# Patient Record
Sex: Male | Born: 1970 | Race: White | Hispanic: No | Marital: Single | Smoking: Never smoker
Health system: Southern US, Community
[De-identification: ages and names within clinical notes are randomized; demographics above are authoritative.]

## PROBLEM LIST (undated history)

## (undated) DIAGNOSIS — F32A Depression, unspecified: Secondary | ICD-10-CM

## (undated) DIAGNOSIS — F329 Major depressive disorder, single episode, unspecified: Secondary | ICD-10-CM

## (undated) HISTORY — DX: Depression, unspecified: F32.A

## (undated) HISTORY — DX: Major depressive disorder, single episode, unspecified: F32.9

---

## 2006-03-17 ENCOUNTER — Emergency Department: Payer: Self-pay | Admitting: Emergency Medicine
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
## 2008-06-08 ENCOUNTER — Emergency Department: Payer: Self-pay | Admitting: Emergency Medicine

## 2010-03-10 ENCOUNTER — Emergency Department: Payer: Self-pay | Admitting: Emergency Medicine

## 2010-05-21 IMAGING — CR DG ELBOW COMPLETE 3+V*L*
1 series · 4 of 4 positions shown · non-contrast
Comparison: none

REASON FOR EXAM: pain
COMMENTS:

PROCEDURE:     DXR - DXR ELBOW LT COMP W/OBLIQUES  - June 08, 2008  [DATE]
RESULT:     Four views of the LEFT elbow reveal the bones to be adequately
mineralized. I do not see evidence of an acute fracture. The overlying soft
tissues are normal in appearance.

[Series 1: view not recorded · 0.17mm/px · 4 of 4 slices shown]
[im 1/4]
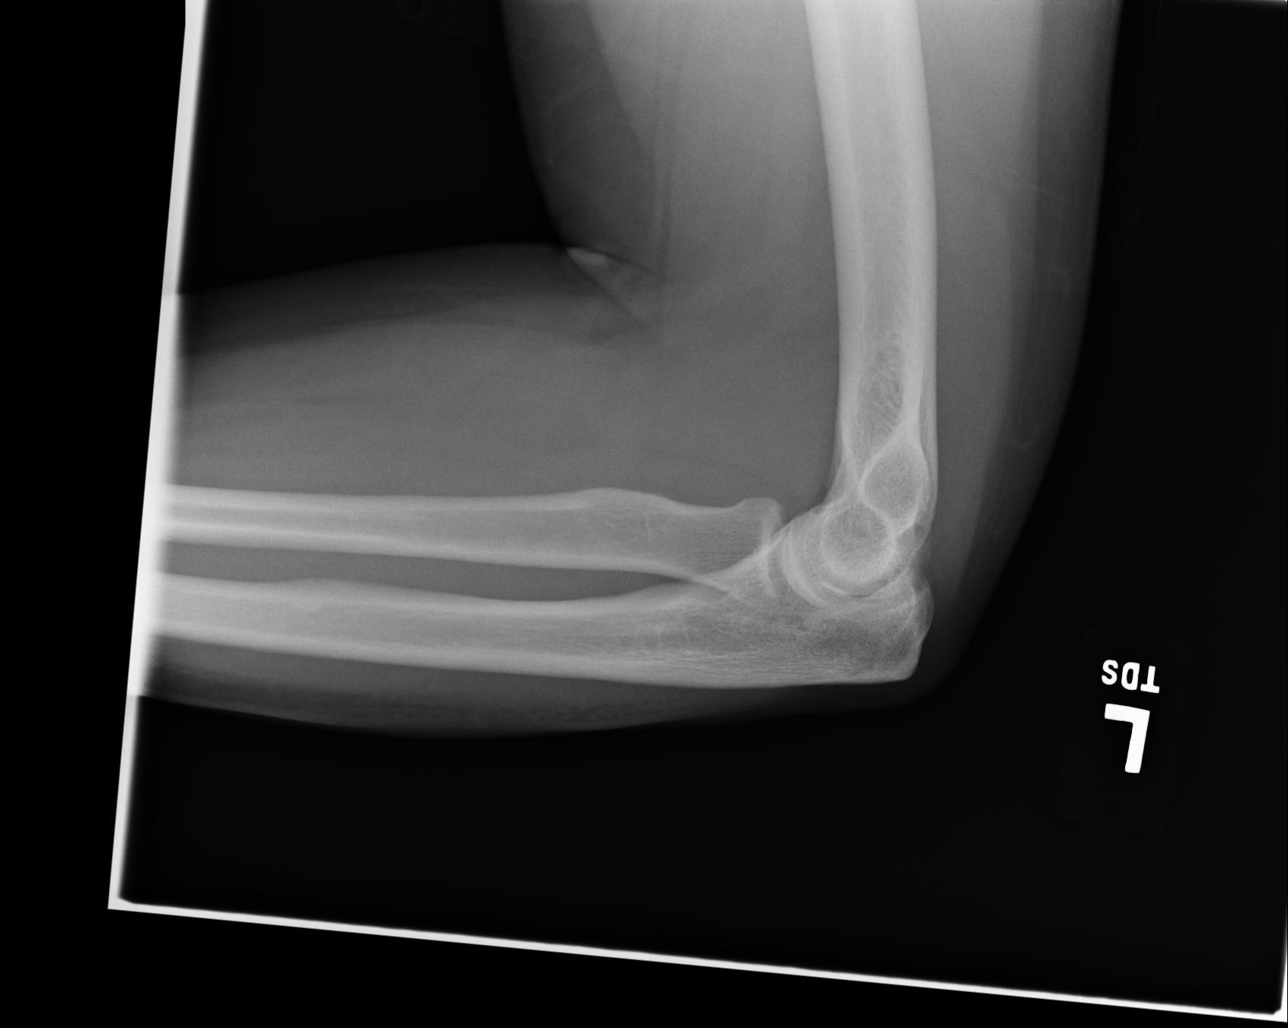
[im 2/4]
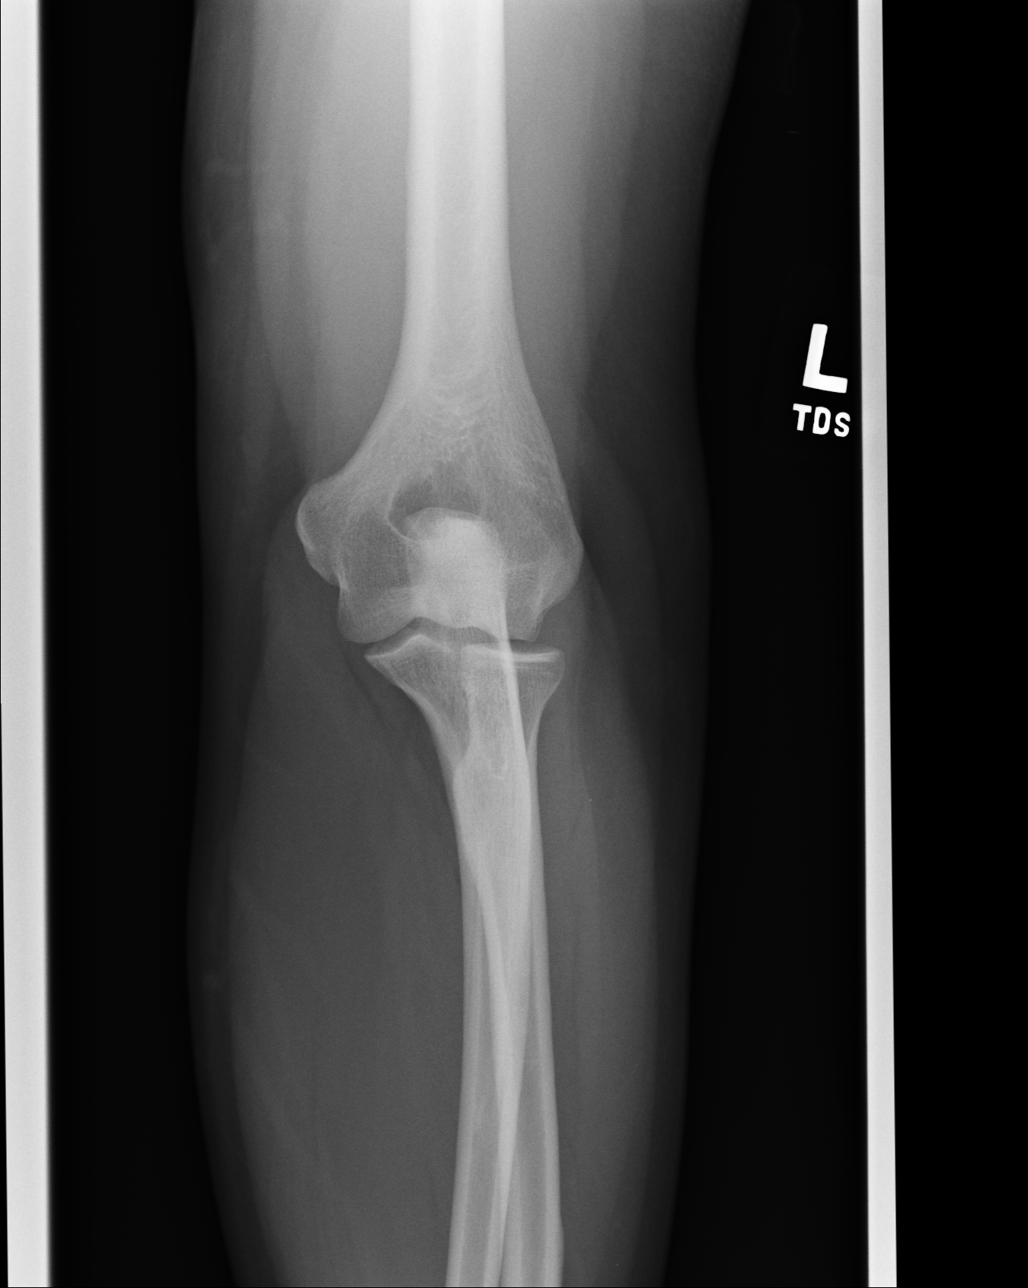
[im 3/4]
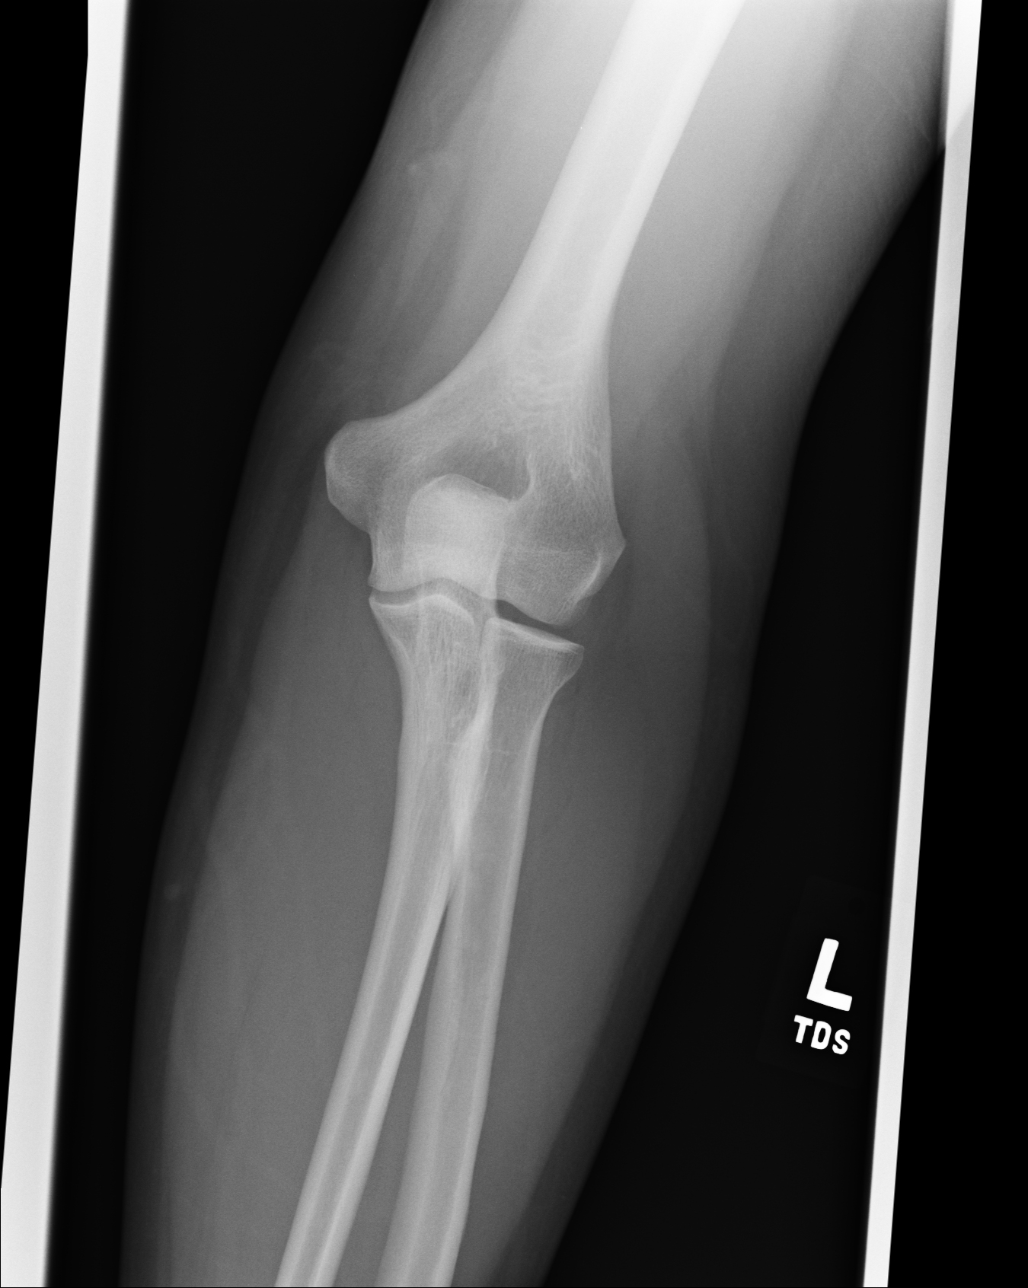
[im 4/4]
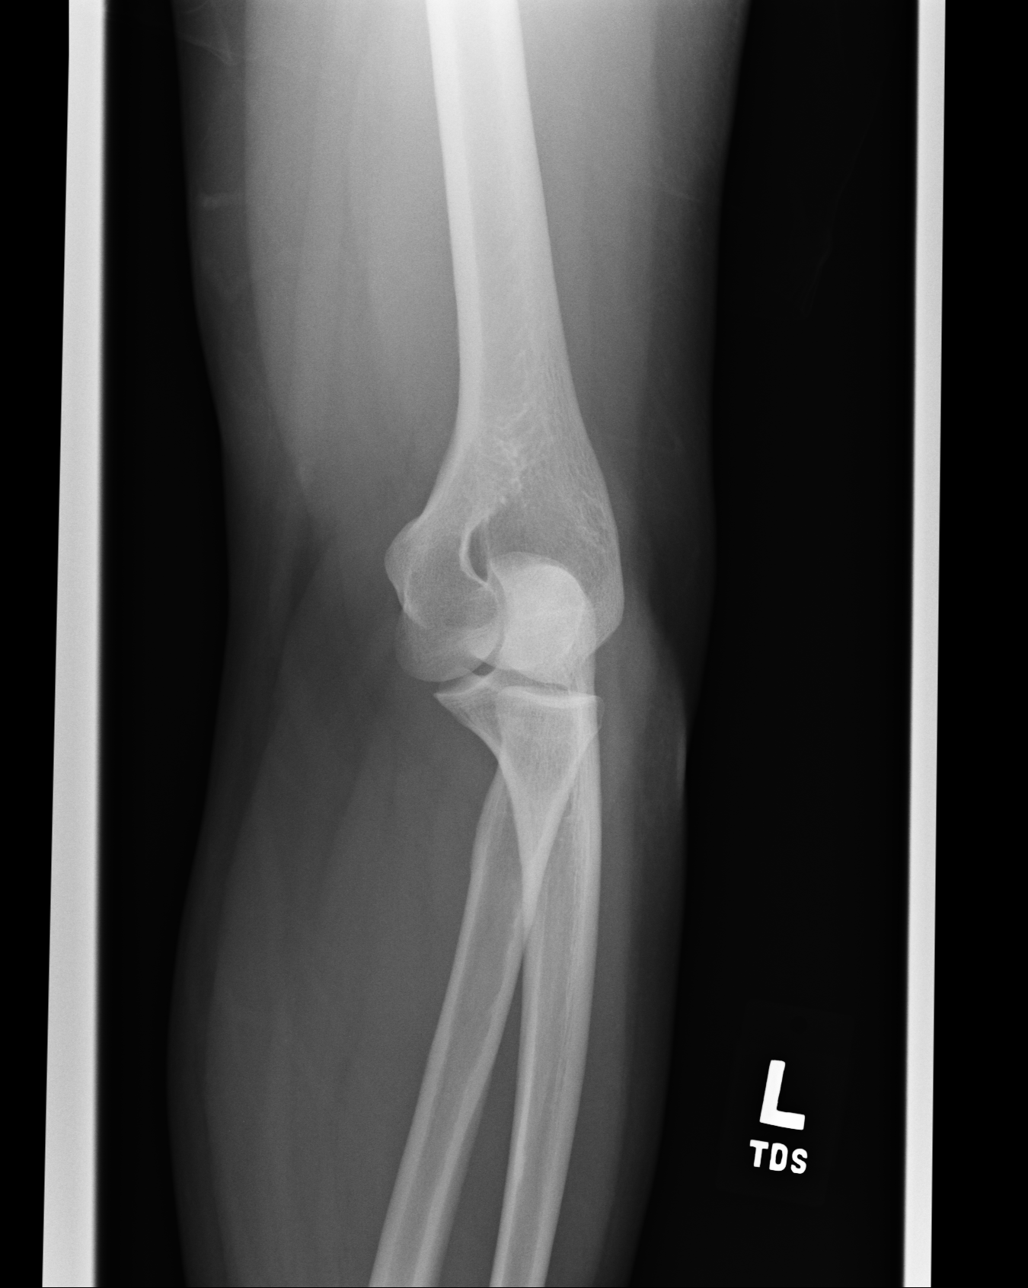

[4 of 4 positions shown; findings below may reference images not displayed]

IMPRESSION: I see no acute bony abnormality of the LEFT elbow. Followup imaging is
available if the patient's symptoms do not resolve in a fashion consistent
with an uncomplicated sprain or contusion.

## 2014-11-25 ENCOUNTER — Ambulatory Visit (INDEPENDENT_AMBULATORY_CARE_PROVIDER_SITE_OTHER): Payer: Self-pay | Admitting: Family Medicine

## 2014-11-25 ENCOUNTER — Encounter: Payer: Self-pay | Admitting: Family Medicine

## 2014-11-25 VITALS — BP 142/83 | HR 91 | Temp 98.2°F | Wt 307.4 lb

## 2014-11-25 DIAGNOSIS — J302 Other seasonal allergic rhinitis: Secondary | ICD-10-CM | POA: Insufficient documentation

## 2014-11-25 DIAGNOSIS — N529 Male erectile dysfunction, unspecified: Secondary | ICD-10-CM

## 2014-11-25 DIAGNOSIS — M545 Low back pain, unspecified: Secondary | ICD-10-CM | POA: Insufficient documentation

## 2014-11-25 MED ORDER — FEXOFENADINE-PSEUDOEPHED ER 180-240 MG PO TB24: 1.0000 | ORAL_TABLET | Freq: Every day | ORAL | Status: DC | PRN

## 2014-11-25 MED ORDER — TADALAFIL 20 MG PO TABS: 20.0000 mg | ORAL_TABLET | Freq: Every day | ORAL | Status: DC | PRN

## 2014-11-25 MED ORDER — AZELASTINE-FLUTICASONE 137-50 MCG/ACT NA SUSP: 2.0000 | Freq: Every day | NASAL | Status: DC

## 2014-11-25 MED ORDER — TRAMADOL HCL 50 MG PO TABS: 50.0000 mg | ORAL_TABLET | Freq: Four times a day (QID) | ORAL | Status: DC | PRN

## 2014-11-25 NOTE — Progress Notes (Signed)
BP 142/83 mmHg  Pulse 91  Temp(Src) 98.2 F (36.8 C) (Oral)  Wt 307 lb 6.4 oz (139.436 kg)   Subjective:    Patient ID: Matthew Cardenas, male    DOB: 05/22/1971, 44 y.o.   MRN: [id number]  HPI: Matthew CiproMichael Kruschke is a 44 y.o. 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 Interim medical history since our last visit reviewed. Allergies and medications reviewed and updated.  No current outpatient prescriptions on file prior to visit.   No current facility-administered medications on file prior to visit.    Review of Systems  Constitutional: Negative.   Respiratory: Negative.   Cardiovascular: Negative.     Per HPI unless specifically indicated above     Objective:    BP 142/83 mmHg  Pulse 91  Temp(Src) 98.2 F (36.8 C) (Oral)  Wt 307 lb 6.4 oz (139.436 kg)  Wt Readings from Last 3 Encounters:  11/25/14 307 lb 6.4 oz (139.436 kg)  04/22/14 305 lb (138.347 kg)    Physical Exam  Constitutional: He is oriented to person, place, and time. He appears well-developed and well-nourished. No distress.  HENT:  Head: Normocephalic and atraumatic.  Right Ear: Hearing normal.  Left Ear: Hearing normal.  Nose: Nose normal.  Eyes: Conjunctivae and lids are normal. Right eye exhibits no discharge. Left eye exhibits no discharge. No scleral icterus.  Cardiovascular: Normal rate and regular rhythm.   Pulmonary/Chest: Effort normal and breath sounds normal. No respiratory distress.  Musculoskeletal: Normal range of motion.  Neurological: He is alert and oriented to person, place, and time.  Skin: Skin is intact. No rash noted.  Psychiatric: He has a normal  mood and affect. His speech is normal and behavior is normal. Judgment and thought content normal. Cognition and memory are normal.    No results found for this or any previous visit.    Assessment & Plan:   Problem List Items Addressed This Visit      Respiratory   Other seasonal allergic rhinitis - Primary   Relevant Medications   Azelastine-Fluticasone 137-50 MCG/ACT SUSP   fexofenadine-pseudoephedrine (ALLEGRA-D 24) 180-240 MG per 24 hr tablet     Genitourinary   Erectile dysfunction   Relevant Medications   tadalafil (CIALIS) 20 MG tablet     Other   Lumbago   Relevant Medications   traMADol (ULTRAM) 50 MG tablet       Meds ordered this encounter  Medications  . Azelastine-Fluticasone 137-50 MCG/ACT SUSP    Sig: Place 2 Squirts into the nose daily.    Dispense:  1 Bottle    Refill:  12  . fexofenadine-pseudoephedrine (ALLEGRA-D 24) 180-240 MG per 24 hr tablet    Sig: Take 1 tablet by mouth daily as needed.    Dispense:  60 tablet    Refill:  6  . tadalafil (CIALIS) 20 MG tablet    Sig: Take 1 tablet (20 mg total) by mouth daily as needed for erectile dysfunction.    Dispense:  10 tablet    Refill:  12  . traMADol (ULTRAM) 50 MG tablet    Sig: Take 1-2  tablets (50-100 mg total) by mouth every 6 (six) hours as needed.    Dispense:  50 tablet    Refill:  1    Follow up plan: Return in about 4 months (around 03/27/2015) for Physical Exam.

## 2014-11-26 ENCOUNTER — Ambulatory Visit: Payer: Self-pay | Admitting: Family Medicine

## 2015-06-04 ENCOUNTER — Other Ambulatory Visit: Payer: Self-pay | Admitting: Family Medicine

## 2015-06-07 ENCOUNTER — Telehealth: Payer: Self-pay | Admitting: Family Medicine

## 2015-06-07 NOTE — Telephone Encounter (Signed)
[facility] [address] [place].  He preferred to speak with Harriett SineNancy or Dr Dossie Arbourrissman.

## 2018-05-10 ENCOUNTER — Other Ambulatory Visit: Payer: Self-pay

## 2018-05-10 ENCOUNTER — Ambulatory Visit: Payer: Self-pay | Admitting: Nurse Practitioner

## 2018-05-10 ENCOUNTER — Encounter: Payer: Self-pay | Admitting: Nurse Practitioner

## 2018-05-10 VITALS — BP 119/84 | HR 81 | Temp 98.0°F | Ht 72.0 in | Wt 254.0 lb

## 2018-05-10 DIAGNOSIS — M545 Low back pain: Secondary | ICD-10-CM

## 2018-05-10 DIAGNOSIS — J302 Other seasonal allergic rhinitis: Secondary | ICD-10-CM

## 2018-05-10 DIAGNOSIS — J40 Bronchitis, not specified as acute or chronic: Secondary | ICD-10-CM | POA: Insufficient documentation

## 2018-05-10 DIAGNOSIS — N529 Male erectile dysfunction, unspecified: Secondary | ICD-10-CM

## 2018-05-10 DIAGNOSIS — G8929 Other chronic pain: Secondary | ICD-10-CM

## 2018-05-10 MED ORDER — TRAMADOL HCL 50 MG PO TABS: 50.0000 mg | ORAL_TABLET | Freq: Three times a day (TID) | ORAL | 0 refills | Status: DC | PRN

## 2018-05-10 MED ORDER — ALBUTEROL SULFATE HFA 108 (90 BASE) MCG/ACT IN AERS: 2.0000 | INHALATION_SPRAY | RESPIRATORY_TRACT | 3 refills | Status: DC | PRN

## 2018-05-10 MED ORDER — TADALAFIL 20 MG PO TABS: 20.0000 mg | ORAL_TABLET | Freq: Every day | ORAL | 3 refills | Status: DC | PRN

## 2018-05-10 MED ORDER — FEXOFENADINE-PSEUDOEPHED ER 180-240 MG PO TB24: 1.0000 | ORAL_TABLET | Freq: Every day | ORAL | 6 refills | Status: DC | PRN

## 2018-05-10 MED ORDER — AZITHROMYCIN 250 MG PO TABS: ORAL_TABLET | ORAL | 0 refills | Status: DC

## 2018-05-10 NOTE — Progress Notes (Signed)
BP 119/84   Pulse 81   Temp 98 F (36.7 C) (Oral)   Ht 6' (1.829 m)   Wt 254 lb (115.2 kg)   SpO2 95%   BMI 34.45 kg/m    Subjective:    Patient ID: Matthew Cardenas, male    DOB: 12/07/1970, 47 y.o.   MRN: [id number]  HPI: Matthew Cardenas is a 47 y.o. male presents for URI symptoms  Chief Complaint  Patient presents with  . Cough    pt states that has had chest congestion for about two days/ pt states stated coughing thick greenish phlegm  . Nasal Congestion   UPPER RESPIRATORY TRACT INFECTION He reports he gets bronchitis every year at this time.  States he gets treated every year for it with z-pack.   Worst symptom: Fever: no Cough: yes Shortness of breath: yes , has had to use inhaler a couple times Wheezing: no Chest pain: no Chest tightness: yes Chest congestion: yes Nasal congestion: yes Runny nose: yes Post nasal drip: yes Sneezing: yes Sore throat: no Swollen glands: no Sinus pressure: no Headache: no Face pain: no Toothache: no Ear pain: no none Ear pressure: no none Eyes red/itching:no Eye drainage/crusting: no  Vomiting: no Rash: no Fatigue: no Sick contacts: yes, works with public Strep contacts: no  Context: worse Recurrent sinusitis: no Relief with OTC cold/cough medications: no  Treatments attempted: cold/sinus and mucinex   LUMBAGO: Reports chronic back pain, that he takes Tramadol for minimally.  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  Is requesting refill on Tramadol.  On review he has a Tramadol script, but he reports he minimally uses.  Last script lasted him over a year, 50 pills.  [facility] of [facility] no recent prescriptions noted.  ERECTILE DYSFUNCTION: Requesting refill of Cialis.  States uses only on occasional basis and it offers benefit.  Relevant past medical, surgical, family and social history reviewed and updated as indicated. Interim medical history since our last visit  reviewed. Allergies and medications reviewed and updated.  Review of Systems  Constitutional: Positive for fatigue. Negative for activity change, appetite change, chills, diaphoresis and fever.  HENT: Positive for postnasal drip, rhinorrhea and sneezing. Negative for congestion, dental problem, ear discharge, ear pain, mouth sores, nosebleeds, sinus pressure, sinus pain, sore throat, trouble swallowing and voice change.   Eyes: Negative for pain, itching and visual disturbance.  Respiratory: Positive for cough, chest tightness and shortness of breath. Negative for choking and wheezing.   Cardiovascular: Negative for chest pain, palpitations and leg swelling.  Gastrointestinal: Negative for abdominal distention, abdominal pain, constipation, diarrhea, nausea and vomiting.  Musculoskeletal: Positive for back pain. Negative for arthralgias and myalgias.  Neurological: Negative for dizziness, syncope, weakness, light-headedness and headaches.  Psychiatric/Behavioral: Negative for behavioral problems, confusion, decreased concentration and sleep disturbance. The patient is not nervous/anxious.    Per HPI unless specifically indicated above     Objective:    BP 119/84   Pulse 81   Temp 98 F (36.7 C) (Oral)   Ht 6' (1.829 m)   Wt 254 lb (115.2 kg)   SpO2 95%   BMI 34.45 kg/m   Wt Readings from Last 3 Encounters:  05/10/18 254 lb (115.2 kg)  11/25/14 (!) 307 lb 6.4 oz (139.4 kg)  04/22/14 (!) 305 lb (138.3 kg)    Physical Exam  Constitutional: He appears well-developed and well-nourished.  HENT:  Head: Normocephalic and atraumatic.  Right Ear: Hearing,  tympanic membrane, external ear and ear canal normal.  Left Ear: Hearing, tympanic membrane, external ear and ear canal normal.  Nose: Mucosal edema and rhinorrhea present. Right sinus exhibits no maxillary sinus tenderness and no frontal sinus tenderness. Left sinus exhibits no maxillary sinus tenderness and no frontal sinus  tenderness.  Mouth/Throat: Oropharynx is clear and moist.  Eyes: Pupils are equal, round, and reactive to light. Conjunctivae, EOM and lids are normal. Right eye exhibits no discharge. Left eye exhibits no discharge.  Neck: No JVD present. Carotid bruit is not present. No thyromegaly present.  Cardiovascular: Normal rate, regular rhythm and normal heart sounds. Exam reveals no gallop.  No murmur heard. Pulmonary/Chest: Effort normal. He has wheezes.  Intermittent expiratory wheezes scattered throughout  Abdominal: Soft. Bowel sounds are normal.  Neurological: He is alert.  Skin: Skin is warm and dry.  Psychiatric: He has a normal mood and affect. His behavior is normal. Judgment and thought content normal.    No results found for this or any previous visit.    Assessment & Plan:   Problem List Items Addressed This Visit      Respiratory   Other seasonal allergic rhinitis   Relevant Medications   fexofenadine-pseudoephedrine (ALLEGRA-D 24) 180-240 MG 24 hr tablet   Bronchitis - Primary    Acute presentation with expiratory wheezes scattered, is about to go on road to travel for job.  Script for zpack and new inhaler provided.  Instructed not to take zpack unless no improvement in symptoms in 5-7 days or worsening symptoms.        Other   Lumbago    Continues on Tramadol with only sparse use, monitor and if increased use send to pain clinic.  Script sent for 30 pill with no refills.      Relevant Medications   traMADol (ULTRAM) 50 MG tablet   Erectile dysfunction    Continues on Cialis with minimal use.  Script sent.      Relevant Medications   tadalafil (ADCIRCA/CIALIS) 20 MG tablet      Controlled substance.  Checked data base and no other refills of controlled substances noted over the past several months.  Follow up plan: Return if symptoms worsen or fail to improve.

## 2018-05-10 NOTE — Assessment & Plan Note (Signed)
Continues on Cialis with minimal use.  Script sent.

## 2018-05-10 NOTE — Assessment & Plan Note (Addendum)
Continues on Tramadol with only sparse use, monitor and if increased use send to pain clinic.  Script sent for 30 pill with no refills.

## 2018-05-10 NOTE — Patient Instructions (Signed)

## 2018-05-10 NOTE — Assessment & Plan Note (Signed)
Acute presentation with expiratory wheezes scattered, is about to go on road to travel for job.  Script for zpack and new inhaler provided.  Instructed not to take zpack unless no improvement in symptoms in 5-7 days or worsening symptoms.

## 2019-02-21 ENCOUNTER — Encounter: Payer: Self-pay | Admitting: Family Medicine

## 2019-02-21 ENCOUNTER — Other Ambulatory Visit: Payer: Self-pay

## 2019-02-21 ENCOUNTER — Ambulatory Visit (INDEPENDENT_AMBULATORY_CARE_PROVIDER_SITE_OTHER): Payer: Self-pay | Admitting: Family Medicine

## 2019-02-21 VITALS — BP 118/82 | HR 73 | Temp 98.1°F | Ht 72.0 in | Wt 259.0 lb

## 2019-02-21 DIAGNOSIS — G8929 Other chronic pain: Secondary | ICD-10-CM

## 2019-02-21 DIAGNOSIS — F419 Anxiety disorder, unspecified: Secondary | ICD-10-CM

## 2019-02-21 DIAGNOSIS — N529 Male erectile dysfunction, unspecified: Secondary | ICD-10-CM

## 2019-02-21 DIAGNOSIS — R030 Elevated blood-pressure reading, without diagnosis of hypertension: Secondary | ICD-10-CM

## 2019-02-21 DIAGNOSIS — M545 Low back pain, unspecified: Secondary | ICD-10-CM

## 2019-02-21 DIAGNOSIS — J302 Other seasonal allergic rhinitis: Secondary | ICD-10-CM

## 2019-02-21 MED ORDER — ALBUTEROL SULFATE HFA 108 (90 BASE) MCG/ACT IN AERS: 2.0000 | INHALATION_SPRAY | RESPIRATORY_TRACT | 3 refills | Status: DC | PRN

## 2019-02-21 MED ORDER — TRAMADOL HCL 50 MG PO TABS: 50.0000 mg | ORAL_TABLET | Freq: Three times a day (TID) | ORAL | 0 refills | Status: DC | PRN

## 2019-02-21 MED ORDER — TADALAFIL 20 MG PO TABS: 20.0000 mg | ORAL_TABLET | Freq: Every day | ORAL | 3 refills | Status: DC | PRN

## 2019-02-21 MED ORDER — ALPRAZOLAM 0.25 MG PO TABS: 0.2500 mg | ORAL_TABLET | Freq: Every day | ORAL | 0 refills | Status: DC | PRN

## 2019-02-21 MED ORDER — FLUOXETINE HCL 10 MG PO CAPS: 10.0000 mg | ORAL_CAPSULE | Freq: Every day | ORAL | 0 refills | Status: DC

## 2019-02-21 MED ORDER — FEXOFENADINE-PSEUDOEPHED ER 180-240 MG PO TB24: 1.0000 | ORAL_TABLET | Freq: Every day | ORAL | 6 refills | Status: DC | PRN

## 2019-02-21 NOTE — Progress Notes (Signed)
BP 118/82   Pulse 73   Temp 98.1 F (36.7 C) (Oral)   Ht 6' (1.829 m)   Wt 259 lb (117.5 kg)   SpO2 97%   BMI 35.13 kg/m    Subjective:    Patient ID: Matthew Cardenas, male    DOB: 09/11/1970, 48 y.o.   MRN: [id number]  HPI: Matthew CiproMichael Tutor is a 48 y.o. male  Chief Complaint  Patient presents with  . Hypertension    fist noticed about 2-3 weeks ago, had had headaches   Notes the last few weeks when exerting himself or getting hot he will get a headache and his BP will go up. Dull, pounding headache typically in the back of the head. Has been out of work since February and has been very stressed about that. His brother gave him a xanax a week ago on a very bad day and states that took everything away for him. Feels much of his issue is anxiety related. Denies mood concerns, SI/HI. Has never had BP issues in the past like this.   Low back pain, using a chiropractor regularly but keeps tramadol around for rare as needed use. Works a very physical job. Notes he never even filled last year's script.   Keeps cialis around because the allegra D causes several weeks of ED each time he has to take it. Rarely if ever takes it.   Has seasonal allergies for which he takes antihistamines and occasionally will need to use an inhaler. Needing a refill.   No flowsheet data found. No flowsheet data found.    Relevant past medical, surgical, family and social history reviewed and updated as indicated. Interim medical history since our last visit reviewed. Allergies and medications reviewed and updated.  Review of Systems  Per HPI unless specifically indicated above     Objective:    BP 118/82   Pulse 73   Temp 98.1 F (36.7 C) (Oral)   Ht 6' (1.829 m)   Wt 259 lb (117.5 kg)   SpO2 97%   BMI 35.13 kg/m   Wt Readings from Last 3 Encounters:  02/21/19 259 lb (117.5 kg)  05/10/18 254 lb (115.2 kg)  11/25/14 (!) 307 lb 6.4 oz (139.4 kg)    Physical Exam Vitals signs and  nursing note reviewed.  Constitutional:      Appearance: Normal appearance.  HENT:     Head: Atraumatic.  Eyes:     Extraocular Movements: Extraocular movements intact.     Conjunctiva/sclera: Conjunctivae normal.  Neck:     Musculoskeletal: Normal range of motion and neck supple.  Cardiovascular:     Rate and Rhythm: Normal rate and regular rhythm.  Pulmonary:     Effort: Pulmonary effort is normal.     Breath sounds: Normal breath sounds.  Musculoskeletal: Normal range of motion.  Skin:    General: Skin is warm and dry.  Neurological:     General: No focal deficit present.     Mental Status: He is oriented to person, place, and time.  Psychiatric:        Mood and Affect: Mood normal.        Thought Content: Thought content normal.        Judgment: Judgment normal.     No results found for this or any previous visit.    Assessment & Plan:   Problem List Items Addressed This Visit      Respiratory   Other seasonal allergic rhinitis  Continue antihistamines prn, albuterol refilled for prn use      Relevant Medications   fexofenadine-pseudoephedrine (ALLEGRA-D 24) 180-240 MG 24 hr tablet     Other   Lumbago    Chronic, sees chiropractor regularly. Will renew tramadol to have on hand for severe flares. Precautions reviewed      Relevant Medications   traMADol (ULTRAM) 50 MG tablet   Erectile dysfunction    Related to use of allegra D. Will renew script to have on hand      Relevant Medications   tadalafil (CIALIS) 20 MG tablet   Anxiety    Will start daily prozac and increase as needed. Small script of xanax given for rare prn use. Discussed never using alongside a tramadol or other controlled substance. Recheck in 1 month      Relevant Medications   FLUoxetine (PROZAC) 10 MG capsule   ALPRAZolam (XANAX) 0.25 MG tablet    Other Visit Diagnoses    Elevated blood pressure, situational    -  Primary   Suspect anxiety or weight gain related. DASH diet,  exercise, weight loss, anxiety control reviewed. Recheck in 1 month   Relevant Medications   tadalafil (CIALIS) 20 MG tablet       Follow up plan: Return in about 4 weeks (around 03/21/2019) for Anxiety, headache f/u.

## 2019-02-24 DIAGNOSIS — F419 Anxiety disorder, unspecified: Secondary | ICD-10-CM | POA: Insufficient documentation

## 2019-02-24 NOTE — Assessment & Plan Note (Signed)
Chronic, sees chiropractor regularly. Will renew tramadol to have on hand for severe flares. Precautions reviewed

## 2019-02-24 NOTE — Assessment & Plan Note (Signed)
Continue antihistamines prn, albuterol refilled for prn use

## 2019-02-24 NOTE — Assessment & Plan Note (Signed)
Will start daily prozac and increase as needed. Small script of xanax given for rare prn use. Discussed never using alongside a tramadol or other controlled substance. Recheck in 1 month

## 2019-02-24 NOTE — Assessment & Plan Note (Signed)
Related to use of allegra D. Will renew script to have on hand

## 2019-03-20 ENCOUNTER — Encounter: Payer: Self-pay | Admitting: Family Medicine

## 2019-03-20 ENCOUNTER — Other Ambulatory Visit: Payer: Self-pay

## 2019-03-20 ENCOUNTER — Ambulatory Visit (INDEPENDENT_AMBULATORY_CARE_PROVIDER_SITE_OTHER): Payer: Self-pay | Admitting: Family Medicine

## 2019-03-20 VITALS — BP 126/83 | HR 73 | Temp 97.9°F | Ht 72.0 in | Wt 265.0 lb

## 2019-03-20 DIAGNOSIS — F419 Anxiety disorder, unspecified: Secondary | ICD-10-CM

## 2019-03-20 DIAGNOSIS — G44219 Episodic tension-type headache, not intractable: Secondary | ICD-10-CM

## 2019-03-20 MED ORDER — ALPRAZOLAM 0.25 MG PO TABS: 0.2500 mg | ORAL_TABLET | Freq: Every day | ORAL | 0 refills | Status: DC | PRN

## 2019-03-20 MED ORDER — FLUOXETINE HCL 20 MG PO CAPS: 20.0000 mg | ORAL_CAPSULE | Freq: Every day | ORAL | 0 refills | Status: DC

## 2019-03-20 MED ORDER — CYCLOBENZAPRINE HCL 10 MG PO TABS: 10.0000 mg | ORAL_TABLET | Freq: Three times a day (TID) | ORAL | 0 refills | Status: DC | PRN

## 2019-03-20 NOTE — Progress Notes (Signed)
BP 126/83   Pulse 73   Temp 97.9 F (36.6 C) (Oral)   Ht 6' (1.829 m)   Wt 265 lb (120.2 kg)   SpO2 96%   BMI 35.94 kg/m    Subjective:    Patient ID: Matthew Cardenas, male    DOB: 09-21-1970, 48 y.o.   MRN: [id number]  HPI: Matthew Cardenas is a 48 y.o. male  Chief Complaint  Patient presents with  . Anxiety  . Headache   Patient here today following up on anxiety. Started on low dose prozac which he states may help some but has not made a huge impact thus far. Has had to take the xanax numerous times the past month in addition, particularly when something related to finances or his employment issues comes up. Notes the xanax helps almost immediately and helps him cope well. Denies side effects or issues with either medication. Notes he has these pressure headaches when he gest anxious and the xanax takes them away more than anything else ever has.   Depression screen [facility] 2/9 03/20/2019  Decreased Interest 1  Down, Depressed, Hopeless 0  PHQ - 2 Score 1  Altered sleeping 1  Tired, decreased energy 1  Change in appetite 1  Feeling bad or failure about yourself  0  Trouble concentrating 2  Moving slowly or fidgety/restless 0  Suicidal thoughts 0  PHQ-9 Score 6   GAD 7 : Generalized Anxiety Score 03/20/2019  Nervous, Anxious, on Edge 2  Control/stop worrying 2  Worry too much - different things 1  Trouble relaxing 1  Restless 1  Easily annoyed or irritable 2  Afraid - awful might happen 0  Total GAD 7 Score 9  Anxiety Difficulty Somewhat difficult   Relevant past medical, surgical, family and social history reviewed and updated as indicated. Interim medical history since our last visit reviewed. Allergies and medications reviewed and updated.  Review of Systems  Per HPI unless specifically indicated above     Objective:    BP 126/83   Pulse 73   Temp 97.9 F (36.6 C) (Oral)   Ht 6' (1.829 m)   Wt 265 lb (120.2 kg)   SpO2 96%   BMI 35.94 kg/m   Wt  Readings from Last 3 Encounters:  03/20/19 265 lb (120.2 kg)  02/21/19 259 lb (117.5 kg)  05/10/18 254 lb (115.2 kg)    Physical Exam Vitals signs and nursing note reviewed.  Constitutional:      Appearance: Normal appearance.  HENT:     Head: Atraumatic.  Eyes:     Extraocular Movements: Extraocular movements intact.     Conjunctiva/sclera: Conjunctivae normal.  Neck:     Musculoskeletal: Normal range of motion and neck supple.  Cardiovascular:     Rate and Rhythm: Normal rate and regular rhythm.  Pulmonary:     Effort: Pulmonary effort is normal.     Breath sounds: Normal breath sounds.  Musculoskeletal: Normal range of motion.  Skin:    General: Skin is warm and dry.  Neurological:     General: No focal deficit present.     Mental Status: He is oriented to person, place, and time.  Psychiatric:        Mood and Affect: Mood normal.        Thought Content: Thought content normal.        Judgment: Judgment normal.     No results found for this or any previous visit.    Assessment &  Plan:   Problem List Items Addressed This Visit      Other   Anxiety - Primary    Increase prozac, continue prn use of xanax. Discussed his frequency of use, going through about 10 pills this past month. Hoping to reduce need/frequency once preventative medication is where it needs to be. Declines counseling, particularly due to lack of insurance and current financial struggles.       Relevant Medications   FLUoxetine (PROZAC) 20 MG capsule   ALPRAZolam (XANAX) 0.25 MG tablet    Other Visit Diagnoses    Episodic tension-type headache, not intractable       With stress/tension triggers. Tx with flexeril prn, stress reduction techniques, continued med mgmt to get anxiety under better control.    Relevant Medications   FLUoxetine (PROZAC) 20 MG capsule   cyclobenzaprine (FLEXERIL) 10 MG tablet     Greater than 25 minutes spent today in direct care and counseling with patient.   Follow  up plan: Return in about 4 weeks (around 04/17/2019) for anxiety.

## 2019-03-24 NOTE — Assessment & Plan Note (Signed)
Increase prozac, continue prn use of xanax. Discussed his frequency of use, going through about 10 pills this past month. Hoping to reduce need/frequency once preventative medication is where it needs to be. Declines counseling, particularly due to lack of insurance and current financial struggles.

## 2019-04-17 ENCOUNTER — Other Ambulatory Visit: Payer: Self-pay

## 2019-04-17 ENCOUNTER — Ambulatory Visit (INDEPENDENT_AMBULATORY_CARE_PROVIDER_SITE_OTHER): Payer: Self-pay | Admitting: Family Medicine

## 2019-04-17 ENCOUNTER — Encounter: Payer: Self-pay | Admitting: Family Medicine

## 2019-04-17 VITALS — BP 117/76 | HR 67 | Temp 98.3°F

## 2019-04-17 DIAGNOSIS — M545 Low back pain, unspecified: Secondary | ICD-10-CM

## 2019-04-17 DIAGNOSIS — G8929 Other chronic pain: Secondary | ICD-10-CM

## 2019-04-17 DIAGNOSIS — F419 Anxiety disorder, unspecified: Secondary | ICD-10-CM

## 2019-04-17 DIAGNOSIS — G44219 Episodic tension-type headache, not intractable: Secondary | ICD-10-CM

## 2019-04-17 MED ORDER — FLUOXETINE HCL 20 MG PO CAPS: 20.0000 mg | ORAL_CAPSULE | Freq: Every day | ORAL | 1 refills | Status: DC

## 2019-04-17 MED ORDER — ALPRAZOLAM 0.25 MG PO TABS: 0.2500 mg | ORAL_TABLET | Freq: Every day | ORAL | 0 refills | Status: DC | PRN

## 2019-04-17 NOTE — Progress Notes (Signed)
BP 117/76   Pulse 67   Temp 98.3 F (36.8 C)   SpO2 96%    Subjective:    Patient ID: Matthew Cardenas, male    DOB: 09/20/1970, 48 y.o.   MRN: [id number]  HPI: Matthew Cardenas is a 48 y.o. male  Chief Complaint  Patient presents with  . Anxiety   Here today for 1 month anxiety f/u. Feeling a great benefit from the increased prozac dose. Has had to use the xanax more than he'd like so has used his 10 tabs already the past month. Has 1 left per patient. Typically his biggest stressor is finances since getting laid off during [place] [employer]. Having a really hard time with that.   States his headaches seem to have dissipated quite a bit since the prozac, thinks a lot of his muscle tension has been relieved with that. Tried the flexeril twice and both times slept for over 14 hours and felt like a zombie for another day after.   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 Used most of his tramadol he has on hand due to this severe flare of his back pain. Denies any radiation of pain down his legs, weakness, numbness or tingling.   Depression screen [facility] 2/9 04/17/2019 03/20/2019  Decreased Interest 1 1  Down, Depressed, Hopeless 0 0  PHQ - 2 Score 1 1  Altered sleeping 0 1  Tired, decreased energy 3 1  Change in appetite 1 1  Feeling bad or failure about yourself  0 0  Trouble concentrating 1 2  Moving slowly or fidgety/restless 0 0  Suicidal thoughts 0 0  PHQ-9 Score 6 6   GAD 7 : Generalized Anxiety Score 04/17/2019 03/20/2019  Nervous, Anxious, on Edge 1 2  Control/stop worrying 1 2  Worry too much - different things 1 1  Trouble relaxing 1 1  Restless 1 1  Easily annoyed or irritable 1 2  Afraid - awful might happen 1 0  Total GAD 7 Score 7 9  Anxiety Difficulty Somewhat difficult Somewhat difficult   Relevant past medical, surgical, family and social history reviewed and updated as  indicated. Interim medical history since our last visit reviewed. Allergies and medications reviewed and updated.  Review of Systems  Per HPI unless specifically indicated above     Objective:    BP 117/76   Pulse 67   Temp 98.3 F (36.8 C)   SpO2 96%   Wt Readings from Last 3 Encounters:  03/20/19 265 lb (120.2 kg)  02/21/19 259 lb (117.5 kg)  05/10/18 254 lb (115.2 kg)    Physical Exam Vitals signs and nursing note reviewed.  Constitutional:      Appearance: Normal appearance.  HENT:     Head: Atraumatic.  Eyes:     Extraocular Movements: Extraocular movements intact.     Conjunctiva/sclera: Conjunctivae normal.  Neck:     Musculoskeletal: Normal range of motion and neck supple.  Cardiovascular:     Rate and Rhythm: Normal rate and regular rhythm.  Pulmonary:     Effort: Pulmonary effort is normal.     Breath sounds: Normal breath sounds.  Musculoskeletal: Normal range of motion.        General: No tenderness or deformity.     Comments: - SLR b/l   Skin:    General: Skin is warm and dry.  Neurological:  General: No focal deficit present.     Mental Status: He is oriented to person, place, and time.  Psychiatric:        Mood and Affect: Mood normal.        Thought Content: Thought content normal.        Judgment: Judgment normal.     No results found for this or any previous visit.    Assessment & Plan:   Problem List Items Addressed This Visit      Other   Lumbago    Exacerbated recently by some heavy machinery lifting at work. Has used most of the tramadol supply he had. Discussed this was to last him 6-12 months and that we would not be able to fill early. Also discussed the significant dangers of taking this while taking the xanax. He is agreeable to OTC pain relievers, heat, massage, and lidocaine patches      Anxiety - Primary    Will increase prozac dose and continue to monitor. Used the xanax more quickly than anticipated due to stressors,  discussed using more sparingly. This supply should last at least 6 months for him. Risks and precautions reviewed      Relevant Medications   ALPRAZolam (XANAX) 0.25 MG tablet   FLUoxetine (PROZAC) 20 MG capsule    Other Visit Diagnoses    Episodic tension-type headache, not intractable       Did not tolerate flexeril. HAs seemed to improve with the prozac helping his stress. Continue to monitor    Relevant Medications   FLUoxetine (PROZAC) 20 MG capsule       Follow up plan: Return in about 6 months (around 10/16/2019) for Mood f/u.

## 2019-04-21 NOTE — Assessment & Plan Note (Addendum)
Will increase prozac dose and continue to monitor. Used the xanax more quickly than anticipated due to stressors, discussed using more sparingly. This supply should last at least 6 months for him. Risks and precautions reviewed

## 2019-04-21 NOTE — Assessment & Plan Note (Signed)
Exacerbated recently by some heavy machinery lifting at work. Has used most of the tramadol supply he had. Discussed this was to last him 6-12 months and that we would not be able to fill early. Also discussed the significant dangers of taking this while taking the xanax. He is agreeable to OTC pain relievers, heat, massage, and lidocaine patches

## 2019-11-20 ENCOUNTER — Other Ambulatory Visit: Payer: Self-pay

## 2019-11-20 ENCOUNTER — Ambulatory Visit (INDEPENDENT_AMBULATORY_CARE_PROVIDER_SITE_OTHER): Payer: Self-pay | Admitting: Family Medicine

## 2019-11-20 ENCOUNTER — Encounter: Payer: Self-pay | Admitting: Family Medicine

## 2019-11-20 VITALS — BP 131/85 | HR 80 | Temp 98.2°F | Wt 275.0 lb

## 2019-11-20 DIAGNOSIS — F419 Anxiety disorder, unspecified: Secondary | ICD-10-CM

## 2019-11-20 DIAGNOSIS — J302 Other seasonal allergic rhinitis: Secondary | ICD-10-CM

## 2019-11-20 DIAGNOSIS — M545 Low back pain: Secondary | ICD-10-CM

## 2019-11-20 DIAGNOSIS — G8929 Other chronic pain: Secondary | ICD-10-CM

## 2019-11-20 MED ORDER — FLUOXETINE HCL 20 MG PO CAPS: 20.0000 mg | ORAL_CAPSULE | Freq: Every day | ORAL | 1 refills | Status: DC

## 2019-11-20 MED ORDER — ALPRAZOLAM 0.25 MG PO TABS: 0.2500 mg | ORAL_TABLET | Freq: Every day | ORAL | 0 refills | Status: DC | PRN

## 2019-11-20 MED ORDER — AZITHROMYCIN 250 MG PO TABS: ORAL_TABLET | ORAL | 0 refills | Status: DC

## 2019-11-20 MED ORDER — TRAMADOL HCL 50 MG PO TABS: 50.0000 mg | ORAL_TABLET | Freq: Three times a day (TID) | ORAL | 0 refills | Status: DC | PRN

## 2019-11-20 MED ORDER — ALBUTEROL SULFATE HFA 108 (90 BASE) MCG/ACT IN AERS: 2.0000 | INHALATION_SPRAY | RESPIRATORY_TRACT | 3 refills | Status: DC | PRN

## 2019-11-20 NOTE — Progress Notes (Signed)
BP 131/85   Pulse 80   Temp 98.2 F (36.8 C) (Oral)   Wt 275 lb (124.7 kg)   SpO2 96%   BMI 37.30 kg/m    Subjective:    Patient ID: Matthew Cardenas, male    DOB: 04-Jul-1970, 49 y.o.   MRN: [id number]  HPI: Matthew Cardenas is a 49 y.o. male  Chief Complaint  Patient presents with  . Anxiety  . Medication Refill   Here today for 6 month f/u chronic conditions.   Anxiety - Doing much better on the prozac, hasn't felt like he needed the xanax for months but wants to have it refilled as he's about to restart working which he feels is going to be very stressful as a transition for him. Denies side effects, SI/HI.   Has not taken the tramadol in quite some time, but about to go back to work where he does very physical work and would like to have some on hand for severe back pain from this type of work. Does use NSAIDs and lidocaine patches prior to reaching for the tramadol.   Has been using numerous natural remedies for his allergies, which have so far been going fairly well but tends to get sinus infections while on the road so tends to keep one on hand in case getting sick. Denies current issues.   Depression screen [facility] 2/9 11/20/2019 04/17/2019 03/20/2019  Decreased Interest 1 1 1   Down, Depressed, Hopeless 0 0 0  PHQ - 2 Score 1 1 1   Altered sleeping 0 0 1  Tired, decreased energy 2 3 1   Change in appetite 1 1 1   Feeling bad or failure about yourself  0 0 0  Trouble concentrating 1 1 2   Moving slowly or fidgety/restless - 0 0  Suicidal thoughts 0 0 0  PHQ-9 Score 5 6 6    GAD 7 : Generalized Anxiety Score 11/20/2019 04/17/2019 03/20/2019  Nervous, Anxious, on Edge 0 1 2  Control/stop worrying 0 1 2  Worry too much - different things 0 1 1  Trouble relaxing 0 1 1  Restless 0 1 1  Easily annoyed or irritable 1 1 2   Afraid - awful might happen 0 1 0  Total GAD 7 Score 1 7 9   Anxiety Difficulty - Somewhat difficult Somewhat difficult   Relevant past medical, surgical,  family and social history reviewed and updated as indicated. Interim medical history since our last visit reviewed. Allergies and medications reviewed and updated.  Review of Systems  Per HPI unless specifically indicated above     Objective:    BP 131/85   Pulse 80   Temp 98.2 F (36.8 C) (Oral)   Wt 275 lb (124.7 kg)   SpO2 96%   BMI 37.30 kg/m   Wt Readings from Last 3 Encounters:  11/20/19 275 lb (124.7 kg)  03/20/19 265 lb (120.2 kg)  02/21/19 259 lb (117.5 kg)    Physical Exam Vitals and nursing note reviewed.  Constitutional:      Appearance: Normal appearance.  HENT:     Head: Atraumatic.  Eyes:     Extraocular Movements: Extraocular movements intact.     Conjunctiva/sclera: Conjunctivae normal.  Cardiovascular:     Rate and Rhythm: Normal rate and regular rhythm.  Pulmonary:     Effort: Pulmonary effort is normal.     Breath sounds: Normal breath sounds.  Musculoskeletal:        General: Normal range of motion.  Cervical back: Normal range of motion and neck supple.  Skin:    General: Skin is warm and dry.  Neurological:     General: No focal deficit present.     Mental Status: He is oriented to person, place, and time.  Psychiatric:        Mood and Affect: Mood normal.        Thought Content: Thought content normal.        Judgment: Judgment normal.     No results found for this or any previous visit.    Assessment & Plan:   Problem List Items Addressed This Visit      Respiratory   Other seasonal allergic rhinitis    Continue natural allergy regimen, has allegra D if needed. Zpak sent to take on his travels in case of illness        Other   Lumbago    Refill tramadol for rare prn use. Counseled on not taking alongside xanax or alcohol, no driving, and addictive risks. Encouraged use of NSAIDs and tylenol for pain unless severe. 1 script should last 6 months      Relevant Medications   traMADol (ULTRAM) 50 MG tablet   Anxiety -  Primary    Much improved on prozac, will refill xanax for rare prn use. One script should last 6 months      Relevant Medications   ALPRAZolam (XANAX) 0.25 MG tablet   FLUoxetine (PROZAC) 20 MG capsule       Follow up plan: Return in about 6 months (around 05/22/2020) for 6 month f/u.

## 2019-11-20 NOTE — Patient Instructions (Signed)
[facility]

## 2019-11-20 NOTE — Assessment & Plan Note (Signed)
Much improved on prozac, will refill xanax for rare prn use. One script should last 6 months

## 2019-11-20 NOTE — Assessment & Plan Note (Signed)
Refill tramadol for rare prn use. Counseled on not taking alongside xanax or alcohol, no driving, and addictive risks. Encouraged use of NSAIDs and tylenol for pain unless severe. 1 script should last 6 months

## 2019-11-20 NOTE — Assessment & Plan Note (Signed)
Continue natural allergy regimen, has allegra D if needed. Zpak sent to take on his travels in case of illness

## 2020-12-07 ENCOUNTER — Ambulatory Visit (INDEPENDENT_AMBULATORY_CARE_PROVIDER_SITE_OTHER): Payer: Self-pay | Admitting: Internal Medicine

## 2020-12-07 ENCOUNTER — Other Ambulatory Visit: Payer: Self-pay

## 2020-12-07 ENCOUNTER — Encounter: Payer: Self-pay | Admitting: Internal Medicine

## 2020-12-07 VITALS — BP 130/79 | HR 70 | Temp 98.5°F | Ht 69.84 in | Wt 272.0 lb

## 2020-12-07 DIAGNOSIS — N529 Male erectile dysfunction, unspecified: Secondary | ICD-10-CM

## 2020-12-07 DIAGNOSIS — Z125 Encounter for screening for malignant neoplasm of prostate: Secondary | ICD-10-CM

## 2020-12-07 DIAGNOSIS — J302 Other seasonal allergic rhinitis: Secondary | ICD-10-CM

## 2020-12-07 DIAGNOSIS — Z136 Encounter for screening for cardiovascular disorders: Secondary | ICD-10-CM

## 2020-12-07 DIAGNOSIS — Z1329 Encounter for screening for other suspected endocrine disorder: Secondary | ICD-10-CM

## 2020-12-07 DIAGNOSIS — R030 Elevated blood-pressure reading, without diagnosis of hypertension: Secondary | ICD-10-CM

## 2020-12-07 MED ORDER — TADALAFIL 20 MG PO TABS: 20.0000 mg | ORAL_TABLET | Freq: Every day | ORAL | 3 refills | Status: DC | PRN

## 2020-12-07 MED ORDER — FEXOFENADINE-PSEUDOEPHED ER 180-240 MG PO TB24: 1.0000 | ORAL_TABLET | Freq: Every day | ORAL | 2 refills | Status: DC | PRN

## 2020-12-07 MED ORDER — ALBUTEROL SULFATE HFA 108 (90 BASE) MCG/ACT IN AERS: 2.0000 | INHALATION_SPRAY | RESPIRATORY_TRACT | 3 refills | Status: DC | PRN

## 2020-12-07 NOTE — Progress Notes (Signed)
BP 130/79   Pulse 70   Temp 98.5 F (36.9 C) (Oral)   Ht 5' 9.84" (1.774 m)   Wt 272 lb (123.4 kg)   SpO2 98%   BMI 39.20 kg/m    Subjective:    Patient ID: Matthew Cardenas, male    DOB: 06-23-1971, 50 y.o.   MRN: [id number]  Chief Complaint  Patient presents with  . Anxiety  . Medication Refill    HPI: Matthew Cardenas is a 50 y.o. male  Pt is here to establish care. Pt has a ho asthma seasonal .  Medication Refill   Chief Complaint  Patient presents with  . Anxiety  . Medication Refill    Relevant past medical, surgical, family and social history reviewed and updated as indicated. Interim medical history since our last visit reviewed. Allergies and medications reviewed and updated.  Review of Systems  Per HPI unless specifically indicated above     Objective:    BP 130/79   Pulse 70   Temp 98.5 F (36.9 C) (Oral)   Ht 5' 9.84" (1.774 m)   Wt 272 lb (123.4 kg)   SpO2 98%   BMI 39.20 kg/m   Wt Readings from Last 3 Encounters:  12/07/20 272 lb (123.4 kg)  11/20/19 275 lb (124.7 kg)  03/20/19 265 lb (120.2 kg)    Physical Exam Vitals and nursing note reviewed.  Constitutional:      General: He is not in acute distress.    Appearance: Normal appearance. He is not ill-appearing or diaphoretic.  HENT:     Head: Normocephalic and atraumatic.     Right Ear: Tympanic membrane and external ear normal. There is no impacted cerumen.     Left Ear: External ear normal.     Nose: No congestion or rhinorrhea.     Mouth/Throat:     Pharynx: No oropharyngeal exudate or posterior oropharyngeal erythema.  Eyes:     Conjunctiva/sclera: Conjunctivae normal.     Pupils: Pupils are equal, round, and reactive to light.  Cardiovascular:     Rate and Rhythm: Normal rate and regular rhythm.     Heart sounds: No murmur heard.   No friction rub. No gallop.  Pulmonary:     Effort: No respiratory distress.     Breath sounds: No stridor. No wheezing or rhonchi.   Chest:     Chest wall: No tenderness.  Abdominal:     General: Abdomen is flat. Bowel sounds are normal.     Palpations: Abdomen is soft. There is no mass.     Tenderness: There is no abdominal tenderness.  Musculoskeletal:     Cervical back: Normal range of motion and neck supple. No rigidity or tenderness.     Left lower leg: No edema.  Skin:    General: Skin is warm and dry.  Neurological:     Mental Status: He is alert.   No results found for this or any previous visit.      Current Outpatient Medications:  .  albuterol (PROVENTIL HFA) 108 (90 Base) MCG/ACT inhaler, Inhale 2 puffs into the lungs every 4 (four) hours as needed., Disp: 13.4 g, Rfl: 3 .  azithromycin (ZITHROMAX) 250 MG tablet, Take 2 tabs day one, then 1 tab daily until complete, Disp: 6 tablet, Rfl: 0 .  tadalafil (CIALIS) 20 MG tablet, Take 1 tablet (20 mg total) by mouth daily as needed for erectile dysfunction., Disp: 10 tablet, Rfl: 3 .  traMADol (ULTRAM) 50  MG tablet, Take 1 tablet (50 mg total) by mouth every 8 (eight) hours as needed., Disp: 30 tablet, Rfl: 0 .  fexofenadine-pseudoephedrine (ALLEGRA-D 24) 180-240 MG 24 hr tablet, Take 1 tablet by mouth daily as needed., Disp: 90 tablet, Rfl: 2    Assessment & Plan:  Allergic rhinitis  Is on allegra/ albuterol for such  Pt feels well.  Erectile dysfunction  Uses cialis for such   Doesn't want to have blood work , none in the system at all. Per pt it makes him sick.   Problem List Items Addressed This Visit       Respiratory   Other seasonal allergic rhinitis   Relevant Medications   fexofenadine-pseudoephedrine (ALLEGRA-D 24) 180-240 MG 24 hr tablet     Other   Erectile dysfunction   Relevant Medications   tadalafil (CIALIS) 20 MG tablet     Follow up plan: No follow-ups on file.

## 2021-08-03 ENCOUNTER — Ambulatory Visit (INDEPENDENT_AMBULATORY_CARE_PROVIDER_SITE_OTHER): Payer: Self-pay | Admitting: Internal Medicine

## 2021-08-03 ENCOUNTER — Encounter: Payer: Self-pay | Admitting: Internal Medicine

## 2021-08-03 ENCOUNTER — Other Ambulatory Visit: Payer: Self-pay

## 2021-08-03 VITALS — BP 110/76 | HR 77 | Temp 98.7°F | Ht 69.84 in | Wt 208.2 lb

## 2021-08-03 DIAGNOSIS — J302 Other seasonal allergic rhinitis: Secondary | ICD-10-CM

## 2021-08-03 DIAGNOSIS — N529 Male erectile dysfunction, unspecified: Secondary | ICD-10-CM

## 2021-08-03 MED ORDER — FEXOFENADINE-PSEUDOEPHED ER 180-240 MG PO TB24: 1.0000 | ORAL_TABLET | Freq: Every day | ORAL | 2 refills | Status: DC | PRN

## 2021-08-03 MED ORDER — TADALAFIL 20 MG PO TABS: 20.0000 mg | ORAL_TABLET | Freq: Every day | ORAL | 3 refills | Status: DC | PRN

## 2021-08-03 NOTE — Progress Notes (Signed)
BP 110/76    Pulse 77    Temp 98.7 F (37.1 C) (Oral)    Ht 5' 9.84" (1.774 m)    Wt 208 lb 3.2 oz (94.4 kg)    SpO2 97%    BMI 30.01 kg/m    Subjective:    Patient ID: Matthew Cardenas, male    DOB: 03-13-1971, 51 y.o.   MRN: [id number]  Chief Complaint  Patient presents with   Hypertension    HPI: Matthew Cardenas is a 51 y.o. male  Hypertension This is a chronic problem. The current episode started more than 1 year ago. The problem is controlled. Pertinent negatives include no anxiety, blurred vision, chest pain, headaches, malaise/fatigue, neck pain, orthopnea, palpitations, peripheral edema, PND, shortness of breath or sweats.   Chief Complaint  Patient presents with   Hypertension    Relevant past medical, surgical, family and social history reviewed and updated as indicated. Interim medical history since our last visit reviewed. Allergies and medications reviewed and updated.  Review of Systems  Constitutional:  Negative for activity change, appetite change, chills, fatigue, fever and malaise/fatigue.  HENT:  Negative for congestion, ear discharge, ear pain and facial swelling.   Eyes:  Negative for blurred vision, pain, discharge and itching.  Respiratory:  Negative for cough, chest tightness, shortness of breath and wheezing.   Cardiovascular:  Negative for chest pain, palpitations, orthopnea, leg swelling and PND.  Gastrointestinal:  Negative for abdominal distention, abdominal pain, blood in stool, constipation, diarrhea, nausea and vomiting.  Endocrine: Negative for cold intolerance, heat intolerance, polydipsia, polyphagia and polyuria.  Genitourinary:  Negative for difficulty urinating, dysuria, flank pain, frequency, hematuria and urgency.  Musculoskeletal:  Negative for arthralgias, gait problem, joint swelling, myalgias and neck pain.  Skin:  Negative for color change, rash and wound.  Neurological:  Negative for dizziness, tremors, speech difficulty,  weakness, light-headedness, numbness and headaches.  Hematological:  Does not bruise/bleed easily.  Psychiatric/Behavioral:  Negative for agitation, confusion, decreased concentration, sleep disturbance and suicidal ideas.    Per HPI unless specifically indicated above     Objective:    BP 110/76    Pulse 77    Temp 98.7 F (37.1 C) (Oral)    Ht 5' 9.84" (1.774 m)    Wt 208 lb 3.2 oz (94.4 kg)    SpO2 97%    BMI 30.01 kg/m   Wt Readings from Last 3 Encounters:  08/03/21 208 lb 3.2 oz (94.4 kg)  12/07/20 272 lb (123.4 kg)  11/20/19 275 lb (124.7 kg)    Physical Exam Vitals and nursing note reviewed.  Constitutional:      General: He is not in acute distress.    Appearance: Normal appearance. He is not ill-appearing or diaphoretic.  HENT:     Head: Normocephalic and atraumatic.     Right Ear: Tympanic membrane and external ear normal. There is no impacted cerumen.     Left Ear: External ear normal.     Nose: No congestion or rhinorrhea.     Mouth/Throat:     Pharynx: No oropharyngeal exudate or posterior oropharyngeal erythema.  Eyes:     Conjunctiva/sclera: Conjunctivae normal.     Pupils: Pupils are equal, round, and reactive to light.  Cardiovascular:     Rate and Rhythm: Normal rate and regular rhythm.     Heart sounds: No murmur heard.   No friction rub. No gallop.  Pulmonary:     Effort: No respiratory distress.  Breath sounds: No stridor. No wheezing or rhonchi.  Chest:     Chest wall: No tenderness.  Abdominal:     General: Abdomen is flat. Bowel sounds are normal.     Palpations: Abdomen is soft. There is no mass.     Tenderness: There is no abdominal tenderness.  Musculoskeletal:     Cervical back: Normal range of motion and neck supple. No rigidity or tenderness.     Left lower leg: No edema.  Skin:    General: Skin is warm and dry.  Neurological:     General: No focal deficit present.     Mental Status: He is alert and oriented to person, place, and  time. Mental status is at baseline.     Cranial Nerves: No cranial nerve deficit.     Sensory: No sensory deficit.  Psychiatric:        Behavior: Behavior normal.        Judgment: Judgment normal.   No results found for this or any previous visit.      Current Outpatient Medications:    albuterol (PROVENTIL HFA) 108 (90 Base) MCG/ACT inhaler, Inhale 2 puffs into the lungs every 4 (four) hours as needed., Disp: 13.4 g, Rfl: 3   fexofenadine-pseudoephedrine (ALLEGRA-D 24) 180-240 MG 24 hr tablet, Take 1 tablet by mouth daily as needed., Disp: 90 tablet, Rfl: 2   tadalafil (CIALIS) 20 MG tablet, Take 1 tablet (20 mg total) by mouth daily as needed for erectile dysfunction., Disp: 10 tablet, Rfl: 3    Assessment & Plan:  Allergic rhinitis/ asthma seasonal worse with humiditiy @ [place]  Is in a travelling fair  Takes allegra and albuterol.  2. Obesity weight loss of 70 lbs, voluntarily lost weight BMI is still 30 Lifestyle modifications advised to pt. A1c at   Portion control and avoiding high carb low fat diet advised.  Diet plan given to pt   exercise plan given and encouraged.  To increase exercise to 150 mins a week ie 21/2 hours a week. Pt verbalises understanding of the above.     Problem List Items Addressed This Visit       Respiratory   Other seasonal allergic rhinitis - Primary   Relevant Medications   fexofenadine-pseudoephedrine (ALLEGRA-D 24) 180-240 MG 24 hr tablet     Other   Erectile dysfunction   Relevant Medications   tadalafil (CIALIS) 20 MG tablet     No orders of the defined types were placed in this encounter.    Meds ordered this encounter  Medications   fexofenadine-pseudoephedrine (ALLEGRA-D 24) 180-240 MG 24 hr tablet    Sig: Take 1 tablet by mouth daily as needed.    Dispense:  90 tablet    Refill:  2   tadalafil (CIALIS) 20 MG tablet    Sig: Take 1 tablet (20 mg total) by mouth daily as needed for erectile dysfunction.    Dispense:  10  tablet    Refill:  3     Follow up plan: No follow-ups on file.

## 2021-08-08 ENCOUNTER — Encounter: Payer: Self-pay | Admitting: Internal Medicine

## 2022-03-01 ENCOUNTER — Other Ambulatory Visit: Payer: Self-pay | Admitting: Internal Medicine

## 2022-03-01 DIAGNOSIS — N529 Male erectile dysfunction, unspecified: Secondary | ICD-10-CM

## 2022-03-01 NOTE — Telephone Encounter (Signed)
Medication Refill - Medication: tadalafil (CIALIS) 20 MG tablet  Has the patient contacted their pharmacy? Yes.    Preferred Pharmacy (with phone number or street name): [id number] - [place], [place] - [address] AT [address] Has the patient been seen for an appointment in the last year OR does the patient have an upcoming appointment? Yes.   Advised pt he needed to make follow up appt. Agent: Please be advised that RX refills may take up to 3 business days. We ask that you follow-up with your pharmacy.

## 2022-03-02 MED ORDER — TADALAFIL 20 MG PO TABS: 20.0000 mg | ORAL_TABLET | Freq: Every day | ORAL | 3 refills | Status: AC | PRN

## 2022-03-02 NOTE — Telephone Encounter (Signed)
Requested medication (s) are due for refill today: yes  Requested medication (s) are on the active medication list: yes  Last refill:  08/03/21 #10 3 RF  Future visit scheduled: yes  Notes to clinic:  overdue lab work    Requested Prescriptions  Pending Prescriptions Disp Refills   tadalafil (CIALIS) 20 MG tablet 10 tablet 3    Sig: Take 1 tablet (20 mg total) by mouth daily as needed for erectile dysfunction.     Urology: Erectile Dysfunction Agents Failed - 03/01/2022  2:39 PM      Failed - AST in normal range and within 360 days    No results found for: "POCAST", "AST"       Failed - ALT in normal range and within 360 days    No results found for: "ALT", "LABALT", "POCALT"       Passed - Last BP in normal range    BP Readings from Last 1 Encounters:  08/03/21 110/76         Passed - Valid encounter within last 12 months    Recent Outpatient Visits           7 months ago Other seasonal allergic rhinitis   [facility] Vigg, Avanti, MD   1 year ago Elevated blood pressure, situational   [facility] Vigg, Avanti, MD   2 years ago Anxiety   [facility] [place] [facility] [place], [place]   2 years ago Anxiety   [facility] [place], [place]   2 years ago Anxiety   [facility] [place], [place], [place]       Future Appointments             In 5 days Cannady, Dorie Rank, NP [employer], [facility]

## 2022-03-05 NOTE — Patient Instructions (Incomplete)
Singulair  Healthy Eating Following a healthy eating pattern may help you to achieve and maintain a healthy body weight, reduce the risk of chronic disease, and live a long and productive life. It is important to follow a healthy eating pattern at an appropriate calorie level for your body. Your nutritional needs should be met primarily through food by choosing a variety of nutrient-rich foods. What are tips for following this plan? Reading food labels Read labels and choose the following: Reduced or low sodium. Juices with 100% fruit juice. Foods with low saturated fats and high polyunsaturated and monounsaturated fats. Foods with whole grains, such as whole wheat, cracked wheat, brown rice, and wild rice. Whole grains that are fortified with folic acid. This is recommended for women who are pregnant or who want to become pregnant. Read labels and avoid the following: Foods with a lot of added sugars. These include foods that contain brown sugar, corn sweetener, corn syrup, dextrose, fructose, glucose, high-fructose corn syrup, honey, invert sugar, lactose, malt syrup, maltose, molasses, raw sugar, sucrose, trehalose, or turbinado sugar. Do not eat more than the following amounts of added sugar per day: 6 teaspoons (25 g) for women. 9 teaspoons (38 g) for men. Foods that contain processed or refined starches and grains. Refined grain products, such as white flour, degermed cornmeal, white bread, and white rice. Shopping Choose nutrient-rich snacks, such as vegetables, whole fruits, and nuts. Avoid high-calorie and high-sugar snacks, such as potato chips, fruit snacks, and candy. Use oil-based dressings and spreads on foods instead of solid fats such as butter, stick margarine, or cream cheese. Limit pre-made sauces, mixes, and "instant" products such as flavored rice, instant noodles, and ready-made pasta. Try more plant-protein sources, such as tofu, tempeh, black beans, edamame, lentils,  nuts, and seeds. Explore eating plans such as the Mediterranean diet or vegetarian diet. Cooking Use oil to saut or stir-fry foods instead of solid fats such as butter, stick margarine, or lard. Try baking, boiling, grilling, or broiling instead of frying. Remove the fatty part of meats before cooking. Steam vegetables in water or broth. Meal planning  At meals, imagine dividing your plate into fourths: One-half of your plate is fruits and vegetables. One-fourth of your plate is whole grains. One-fourth of your plate is protein, especially lean meats, poultry, eggs, tofu, beans, or nuts. Include low-fat dairy as part of your daily diet. Lifestyle Choose healthy options in all settings, including home, work, school, restaurants, or stores. Prepare your food safely: Wash your hands after handling raw meats. Keep food preparation surfaces clean by regularly washing with hot, soapy water. Keep raw meats separate from ready-to-eat foods, such as fruits and vegetables. Cook seafood, meat, poultry, and eggs to the recommended internal temperature. Store foods at safe temperatures. In general: Keep cold foods at 25F (4.4C) or below. Keep hot foods at 125F (60C) or above. Keep your freezer at [facility] (-17.8C) or below. Foods are no longer safe to eat when they have been between the temperatures of 40-125F (4.4-60C) for more than 2 hours. What foods should I eat? Fruits Aim to eat 2 cup-equivalents of fresh, canned (in natural juice), or frozen fruits each day. Examples of 1 cup-equivalent of fruit include 1 small apple, 8 large strawberries, 1 cup canned fruit,  cup dried fruit, or 1 cup 100% juice. Vegetables Aim to eat 2-3 cup-equivalents of fresh and frozen vegetables each day, including different varieties and colors. Examples of 1 cup-equivalent of vegetables include 2 medium carrots, 2  cups raw, leafy greens, 1 cup chopped vegetable (raw or cooked), or 1 medium baked  potato. Grains Aim to eat 6 ounce-equivalents of whole grains each day. Examples of 1 ounce-equivalent of grains include 1 slice of bread, 1 cup ready-to-eat cereal, 3 cups popcorn, or  cup cooked rice, pasta, or cereal. Meats and other proteins Aim to eat 5-6 ounce-equivalents of protein each day. Examples of 1 ounce-equivalent of protein include 1 egg, 1/2 cup nuts or seeds, or 1 tablespoon (16 g) peanut butter. A cut of meat or fish that is the size of a deck of cards is about 3-4 ounce-equivalents. Of the protein you eat each week, try to have at least 8 ounces come from seafood. This includes salmon, trout, herring, and anchovies. Dairy Aim to eat 3 cup-equivalents of fat-free or low-fat dairy each day. Examples of 1 cup-equivalent of dairy include 1 cup (240 mL) milk, 8 ounces (250 g) yogurt, 1 ounces (44 g) natural cheese, or 1 cup (240 mL) fortified soy milk. Fats and oils Aim for about 5 teaspoons (21 g) per day. Choose monounsaturated fats, such as canola and olive oils, avocados, peanut butter, and most nuts, or polyunsaturated fats, such as sunflower, corn, and soybean oils, walnuts, pine nuts, sesame seeds, sunflower seeds, and flaxseed. Beverages Aim for six 8-oz glasses of water per day. Limit coffee to three to five 8-oz cups per day. Limit caffeinated beverages that have added calories, such as soda and energy drinks. Limit alcohol intake to no more than 1 drink a day for nonpregnant women and 2 drinks a day for men. One drink equals 12 oz of beer (355 mL), 5 oz of wine (148 mL), or 1 oz of hard liquor (44 mL). Seasoning and other foods Avoid adding excess amounts of salt to your foods. Try flavoring foods with herbs and spices instead of salt. Avoid adding sugar to foods. Try using oil-based dressings, sauces, and spreads instead of solid fats. This information is based on general [place] nutrition guidelines. For more information, visit [employer]. Exact amounts may vary  based on your nutrition needs. Summary A healthy eating plan may help you to maintain a healthy weight, reduce the risk of chronic diseases, and stay active throughout your life. Plan your meals. Make sure you eat the right portions of a variety of nutrient-rich foods. Try baking, boiling, grilling, or broiling instead of frying. Choose healthy options in all settings, including home, work, school, restaurants, or stores. This information is not intended to replace advice given to you by your health care provider. Make sure you discuss any questions you have with your health care provider. Document Revised: 02/08/2021 Document Reviewed: 02/08/2021 Elsevier Patient Education  [place].

## 2022-03-07 ENCOUNTER — Encounter: Payer: Self-pay | Admitting: Nurse Practitioner

## 2022-03-07 ENCOUNTER — Ambulatory Visit (INDEPENDENT_AMBULATORY_CARE_PROVIDER_SITE_OTHER): Payer: Self-pay | Admitting: Nurse Practitioner

## 2022-03-07 VITALS — BP 121/77 | HR 57 | Temp 98.3°F | Wt 212.7 lb

## 2022-03-07 DIAGNOSIS — Z13220 Encounter for screening for lipoid disorders: Secondary | ICD-10-CM

## 2022-03-07 DIAGNOSIS — N528 Other male erectile dysfunction: Secondary | ICD-10-CM

## 2022-03-07 DIAGNOSIS — N400 Enlarged prostate without lower urinary tract symptoms: Secondary | ICD-10-CM

## 2022-03-07 DIAGNOSIS — J302 Other seasonal allergic rhinitis: Secondary | ICD-10-CM

## 2022-03-07 DIAGNOSIS — Z136 Encounter for screening for cardiovascular disorders: Secondary | ICD-10-CM

## 2022-03-07 MED ORDER — ALBUTEROL SULFATE HFA 108 (90 BASE) MCG/ACT IN AERS: 2.0000 | INHALATION_SPRAY | RESPIRATORY_TRACT | 3 refills | Status: AC | PRN

## 2022-03-07 MED ORDER — AZITHROMYCIN 250 MG PO TABS: ORAL_TABLET | ORAL | 0 refills | Status: AC

## 2022-03-07 MED ORDER — FEXOFENADINE-PSEUDOEPHED ER 180-240 MG PO TB24: 1.0000 | ORAL_TABLET | Freq: Every day | ORAL | 2 refills | Status: AC | PRN

## 2022-03-07 NOTE — Assessment & Plan Note (Signed)
Chronic, ongoing.  Will continue Allegra-D at this time which offers him benefit.  Discussed alternate options, such as Singulair, with him.  Send in script for Zpack to use as needed only while on road -- to avoid ER and UC setting.  He agrees to labs, discussed at length with him.

## 2022-03-07 NOTE — Progress Notes (Signed)
BP 121/77   Pulse (!) 57   Temp 98.3 F (36.8 C) (Oral)   Wt 212 lb 11.2 oz (96.5 kg)   SpO2 98%   BMI 30.66 kg/m    Subjective:    Patient ID: Matthew Cardenas, male    DOB: 1970-06-29, 51 y.o.   MRN: [id number]  HPI: [name] is a 51 y.o. male  Chief Complaint  Patient presents with   Allergic Rhinitis    ALLERGIES Reports he travels on road a lot, when gets down to [place] his allergies bother him a lot.  Previous PCP, Dr. Dossie Arbour, filled for him a Z-Pack & Allegra D every 6 months due to this to help if he was on road avoid ER setting.  Used to attend [facility] and have allergy shots, but they offered no benefit over 2 years.  He refuses lab work, reports he takes good care of his overall health.   Duration: chronic Symptoms occur seasonally: no Symptoms occur perenially: yes depends on location when driving Satisfied with current treatment: yes Allergist evaluation in past: no Allergen injection immunotherapy: yes Recurrent sinus infections: no ENT evaluation in past: yes Known environmental allergy: yes Indoor pets: yes History of asthma: no Current allergy medications: Allegra D & Albuterol Treatments attempted: Flonase (burnt nose), Claritin, Allegra, Xyzal, and Zyrtec   Relevant past medical, surgical, family and social history reviewed and updated as indicated. Interim medical history since our last visit reviewed. Allergies and medications reviewed and updated.  Review of Systems  Constitutional:  Negative for activity change, diaphoresis, fatigue and fever.  Respiratory:  Negative for cough, chest tightness, shortness of breath and wheezing.   Cardiovascular:  Negative for chest pain, palpitations and leg swelling.  Gastrointestinal: Negative.   Neurological: Negative.  Negative for weakness.  Psychiatric/Behavioral: Negative.      Per HPI unless specifically indicated above     Objective:    BP 121/77   Pulse (!) 57   Temp 98.3  F (36.8 C) (Oral)   Wt 212 lb 11.2 oz (96.5 kg)   SpO2 98%   BMI 30.66 kg/m   Wt Readings from Last 3 Encounters:  03/07/22 212 lb 11.2 oz (96.5 kg)  08/03/21 208 lb 3.2 oz (94.4 kg)  12/07/20 272 lb (123.4 kg)    Physical Exam Vitals and nursing note reviewed.  Constitutional:      General: He is awake. He is not in acute distress.    Appearance: He is well-developed and well-groomed. He is obese. He is not ill-appearing or toxic-appearing.  HENT:     Head: Normocephalic and atraumatic.     Right Ear: Hearing normal. No drainage.     Left Ear: Hearing normal. No drainage.  Eyes:     General: Lids are normal.        Right eye: No discharge.        Left eye: No discharge.     Conjunctiva/sclera: Conjunctivae normal.     Pupils: Pupils are equal, round, and reactive to light.  Neck:     Thyroid: No thyromegaly.     Vascular: No carotid bruit.  Cardiovascular:     Rate and Rhythm: Normal rate and regular rhythm.     Heart sounds: Normal heart sounds, S1 normal and S2 normal. No murmur heard.    No gallop.  Pulmonary:     Effort: Pulmonary effort is normal. No accessory muscle usage or respiratory distress.     Breath sounds: Normal breath  sounds.  Abdominal:     General: Bowel sounds are normal.     Palpations: Abdomen is soft. There is no hepatomegaly or splenomegaly.  Musculoskeletal:        General: Normal range of motion.     Cervical back: Normal range of motion and neck supple.     Right lower leg: No edema.     Left lower leg: No edema.  Skin:    General: Skin is warm and dry.     Capillary Refill: Capillary refill takes less than 2 seconds.  Neurological:     Mental Status: He is alert and oriented to person, place, and time.     Deep Tendon Reflexes: Reflexes are normal and symmetric.     Reflex Scores:      Brachioradialis reflexes are 2+ on the right side and 2+ on the left side.      Patellar reflexes are 2+ on the right side and 2+ on the left  side. Psychiatric:        Attention and Perception: Attention normal.        Mood and Affect: Mood normal.        Speech: Speech normal.        Behavior: Behavior normal. Behavior is cooperative.        Thought Content: Thought content normal.     No results found for this or any previous visit.    Assessment & Plan:   Problem List Items Addressed This Visit       Respiratory   Other seasonal allergic rhinitis - Primary    Chronic, ongoing.  Will continue Allegra-D at this time which offers him benefit.  Discussed alternate options, such as Singulair, with him.  Send in script for Zpack to use as needed only while on road -- to avoid ER and UC setting.  He agrees to labs, discussed at length with him.        Relevant Medications   fexofenadine-pseudoephedrine (ALLEGRA-D 24) 180-240 MG 24 hr tablet   Other Relevant Orders   CBC with Differential/Platelet   Comprehensive metabolic panel     Other   Erectile dysfunction    Refills sent in.      Other Visit Diagnoses     Benign prostatic hyperplasia without lower urinary tract symptoms       PSA on labs.     Relevant Orders   PSA   Encounter for lipid screening for cardiovascular disease       Lipid panel today.   Relevant Orders   Lipid Panel w/o Chol/HDL Ratio        Follow up plan: Return in about 6 months (around 09/05/2022) for Allergic Rhinitis and Lab Review.

## 2022-03-07 NOTE — Assessment & Plan Note (Signed)
Refills sent in

## 2023-07-17 ENCOUNTER — Telehealth: Payer: Self-pay

## 2023-07-17 NOTE — Transitions of Care (Post Inpatient/ED Visit) (Signed)
   07/17/2023  Name: Matthew Cardenas MRN: [id number] DOB: 03-23-1971  Today's TOC FU Call Status: Today's TOC FU Call Status:: Successful TOC FU Call Completed TOC FU Call Complete Date: 07/17/23 Patient's Name and Date of Birth confirmed.  Transition Care Management Follow-up Telephone Call Date of Discharge: 07/14/23 Discharge Facility: Other [facility]) Name of Other (Non[facility]) Discharge Facility: [facility] - Emergency Type of Discharge: Emergency Department Reason for ED Visit: Other: Any questions or concerns?: No  Items Reviewed: Did you receive and understand the discharge instructions provided?: Yes Medications obtained,verified, and reconciled?: Yes (Medications Reviewed) Any new allergies since your discharge?: No Dietary orders reviewed?: No Do you have support at home?: No  Medications Reviewed Today: Medications Reviewed Today     Reviewed by Pablo Ledger, CMA (Certified Medical Assistant) on 07/17/23 at 1348  Med List Status: <None>   Medication Order Taking? Sig Documenting Provider Last Dose Status Informant  albuterol (PROVENTIL HFA) 108 (90 Base) MCG/ACT inhaler [id number] Yes Inhale 2 puffs into the lungs every 4 (four) hours as needed. Aura Dials T, NP Taking Active   fexofenadine-pseudoephedrine (ALLEGRA-D 24) 180-240 MG 24 hr tablet [id number] Yes Take 1 tablet by mouth daily as needed. Aura Dials T, NP Taking Active   tadalafil (CIALIS) 20 MG tablet [id number] Yes Take 1 tablet (20 mg total) by mouth daily as needed for erectile dysfunction. Marjie Skiff, NP Taking Active             Home Care and Equipment/Supplies: Were Home Health Services Ordered?: No Any new equipment or medical supplies ordered?: No  Functional Questionnaire: Do you need assistance with bathing/showering or dressing?: No Do you need assistance with meal preparation?: No Do you need assistance with eating?: No Do you  have difficulty maintaining continence: No Do you need assistance with getting out of bed/getting out of a chair/moving?: No Do you have difficulty managing or taking your medications?: No  Follow up appointments reviewed: PCP Follow-up appointment confirmed?: No (Patient states he is down in [place] for work and had to go to the ER there because of his allergies. Patient states that he had to go there and spend $1000 because we wouldnt send in a zpack for him like Dr. Dossie Arbour previously did. Apologized to pt.) MD Provider Line Number:[phone] Given: No Specialist Hospital Follow-up appointment confirmed?: No Do you need transportation to your follow-up appointment?: No Do you understand care options if your condition(s) worsen?: Yes-patient verbalized understanding    SIGNATURE: Wilhemena Durie, CMA
